# Patient Record
Sex: Female | Born: 1935 | Race: White | Hispanic: No | Smoking: Never smoker
Health system: Southern US, Community
[De-identification: ages and names within clinical notes are randomized; demographics above are authoritative.]

## PROBLEM LIST (undated history)

## (undated) DIAGNOSIS — G309 Alzheimer's disease, unspecified: Secondary | ICD-10-CM

## (undated) DIAGNOSIS — F22 Delusional disorders: Secondary | ICD-10-CM

## (undated) DIAGNOSIS — F0280 Dementia in other diseases classified elsewhere without behavioral disturbance: Secondary | ICD-10-CM

## (undated) HISTORY — PX: FOOT SURGERY: SHX648

---

## 2004-08-16 ENCOUNTER — Ambulatory Visit: Payer: Self-pay | Admitting: Internal Medicine

## 2004-08-28 ENCOUNTER — Ambulatory Visit: Payer: Self-pay | Admitting: Internal Medicine

## 2004-09-04 ENCOUNTER — Ambulatory Visit (HOSPITAL_COMMUNITY): Admission: RE | Admit: 2004-09-04 | Discharge: 2004-09-04 | Payer: Self-pay | Admitting: Internal Medicine

## 2004-09-04 ENCOUNTER — Encounter (INDEPENDENT_AMBULATORY_CARE_PROVIDER_SITE_OTHER): Payer: Self-pay | Admitting: *Deleted

## 2004-09-04 ENCOUNTER — Encounter (INDEPENDENT_AMBULATORY_CARE_PROVIDER_SITE_OTHER): Payer: Self-pay | Admitting: Specialist

## 2004-09-04 ENCOUNTER — Ambulatory Visit: Payer: Self-pay | Admitting: Internal Medicine

## 2009-01-20 DIAGNOSIS — M1990 Unspecified osteoarthritis, unspecified site: Secondary | ICD-10-CM | POA: Insufficient documentation

## 2009-01-20 DIAGNOSIS — F411 Generalized anxiety disorder: Secondary | ICD-10-CM | POA: Insufficient documentation

## 2009-01-20 DIAGNOSIS — M810 Age-related osteoporosis without current pathological fracture: Secondary | ICD-10-CM | POA: Insufficient documentation

## 2009-01-20 DIAGNOSIS — K921 Melena: Secondary | ICD-10-CM

## 2009-01-20 DIAGNOSIS — D126 Benign neoplasm of colon, unspecified: Secondary | ICD-10-CM

## 2009-01-24 ENCOUNTER — Ambulatory Visit: Payer: Self-pay | Admitting: Internal Medicine

## 2009-01-24 DIAGNOSIS — R195 Other fecal abnormalities: Secondary | ICD-10-CM

## 2009-01-24 DIAGNOSIS — Z860100 Personal history of colon polyps, unspecified: Secondary | ICD-10-CM | POA: Insufficient documentation

## 2009-02-01 ENCOUNTER — Ambulatory Visit: Payer: Self-pay | Admitting: Internal Medicine

## 2009-11-07 ENCOUNTER — Emergency Department (HOSPITAL_BASED_OUTPATIENT_CLINIC_OR_DEPARTMENT_OTHER): Admission: EM | Admit: 2009-11-07 | Discharge: 2009-11-07 | Payer: Self-pay | Admitting: Emergency Medicine

## 2010-12-21 LAB — HEPATIC FUNCTION PANEL
ALT: 18 U/L (ref 0–35)
AST: 32 U/L (ref 0–37)
Alkaline Phosphatase: 86 U/L (ref 39–117)
Bilirubin, Direct: 0 mg/dL (ref 0.0–0.3)
Indirect Bilirubin: 0.3 mg/dL (ref 0.3–0.9)

## 2010-12-21 LAB — CBC
MCHC: 33.7 g/dL (ref 30.0–36.0)
Platelets: 215 10*3/uL (ref 150–400)
RDW: 12.6 % (ref 11.5–15.5)

## 2010-12-21 LAB — URINALYSIS, ROUTINE W REFLEX MICROSCOPIC
Bilirubin Urine: NEGATIVE
Glucose, UA: NEGATIVE mg/dL
Hgb urine dipstick: NEGATIVE
Ketones, ur: NEGATIVE mg/dL
Protein, ur: NEGATIVE mg/dL
pH: 6.5 (ref 5.0–8.0)

## 2010-12-21 LAB — BASIC METABOLIC PANEL
BUN: 16 mg/dL (ref 6–23)
Calcium: 9 mg/dL (ref 8.4–10.5)
Creatinine, Ser: 0.7 mg/dL (ref 0.4–1.2)
GFR calc non Af Amer: >60 mL/min (ref >60)
Glucose, Bld: 96 mg/dL (ref 70–99)

## 2010-12-21 LAB — DIFFERENTIAL
Basophils Absolute: 0 10*3/uL (ref 0.0–0.1)
Basophils Relative: 1 % (ref 0–1)
Lymphocytes Relative: 44 % (ref 12–46)
Neutro Abs: 1.7 10*3/uL (ref 1.7–7.7)

## 2013-02-18 ENCOUNTER — Encounter (HOSPITAL_COMMUNITY): Payer: Self-pay | Admitting: *Deleted

## 2013-02-18 ENCOUNTER — Emergency Department (HOSPITAL_COMMUNITY): Payer: Medicare Other

## 2013-02-18 ENCOUNTER — Emergency Department (HOSPITAL_COMMUNITY)
Admission: EM | Admit: 2013-02-18 | Discharge: 2013-02-18 | Disposition: A | Discharge status: Home / Self Care | Payer: Medicare Other | Attending: Emergency Medicine | Admitting: Emergency Medicine

## 2013-02-18 DIAGNOSIS — F911 Conduct disorder, childhood-onset type: Secondary | ICD-10-CM | POA: Insufficient documentation

## 2013-02-18 DIAGNOSIS — F0390 Unspecified dementia without behavioral disturbance: Secondary | ICD-10-CM

## 2013-02-18 DIAGNOSIS — Z87828 Personal history of other (healed) physical injury and trauma: Secondary | ICD-10-CM | POA: Insufficient documentation

## 2013-02-18 DIAGNOSIS — Z79899 Other long term (current) drug therapy: Secondary | ICD-10-CM | POA: Insufficient documentation

## 2013-02-18 DIAGNOSIS — S0003XA Contusion of scalp, initial encounter: Secondary | ICD-10-CM | POA: Insufficient documentation

## 2013-02-18 DIAGNOSIS — Y939 Activity, unspecified: Secondary | ICD-10-CM | POA: Insufficient documentation

## 2013-02-18 DIAGNOSIS — IMO0002 Reserved for concepts with insufficient information to code with codable children: Secondary | ICD-10-CM | POA: Insufficient documentation

## 2013-02-18 DIAGNOSIS — Z880 Allergy status to penicillin: Secondary | ICD-10-CM | POA: Insufficient documentation

## 2013-02-18 DIAGNOSIS — S1093XA Contusion of unspecified part of neck, initial encounter: Secondary | ICD-10-CM | POA: Insufficient documentation

## 2013-02-18 DIAGNOSIS — Y929 Unspecified place or not applicable: Secondary | ICD-10-CM | POA: Insufficient documentation

## 2013-02-18 LAB — URINALYSIS, ROUTINE W REFLEX MICROSCOPIC
Bilirubin Urine: NEGATIVE
Glucose, UA: NEGATIVE mg/dL
Hgb urine dipstick: NEGATIVE
Ketones, ur: 40 mg/dL — AB
Protein, ur: NEGATIVE mg/dL

## 2013-02-18 LAB — COMPREHENSIVE METABOLIC PANEL
Albumin: 3.6 g/dL (ref 3.5–5.2)
Alkaline Phosphatase: 75 U/L (ref 39–117)
BUN: 18 mg/dL (ref 6–23)
Chloride: 103 mEq/L (ref 96–112)
Potassium: 3.8 mEq/L (ref 3.5–5.1)
Sodium: 139 mEq/L (ref 135–145)
Total Bilirubin: 0.4 mg/dL (ref 0.3–1.2)

## 2013-02-18 LAB — CBC
MCH: 28.7 pg (ref 26.0–34.0)
MCHC: 32.8 g/dL (ref 30.0–36.0)
MCV: 87.5 fL (ref 78.0–100.0)
Platelets: 258 10*3/uL (ref 150–400)
RBC: 3.59 MIL/uL — ABNORMAL LOW (ref 3.87–5.11)

## 2013-02-18 MED ORDER — SODIUM CHLORIDE 0.9 % IV BOLUS (SEPSIS)
1000.0000 mL | Freq: Once | INTRAVENOUS | Status: AC
  Administered 2013-02-18: 1000 mL via INTRAVENOUS

## 2013-02-18 NOTE — ED Notes (Signed)
Per [facility] sent pt. Reports pt refusing medication for several days. Displaying aggressive behavior X 1 day. Pt oriented to self. Unable to name date/time/location. Hx of dementia.

## 2013-02-18 NOTE — ED Notes (Signed)
[id number]<HQ> Expected date:<BR> Expected time:<BR> Means of arrival:<BR> Comments:<BR> Dementia/aggressive pt

## 2013-02-18 NOTE — ED Provider Notes (Signed)
History     CSN: [id number]  Arrival date & time 02/18/13  1734   First MD Initiated Contact with Patient 02/18/13 1823      Chief Complaint  Patient presents with  . Altered Mental Status  . Aggressive Behavior     The history is provided by the patient, medical records, the nursing home, a relative and a caregiver. History limited by: Level V caveat: Dementia.   the patient was brought to the emergency department from memory care unit at the nursing facility for decreased oral intake over the past several days and increasing aggressive behavior.  As reported that she normally has a good appetite no the past several days she has not eaten or drank in as much as she usually does and she was sent to the emergency department.  The patient is on Xanax, Haldol, cervical, Depakote. The majority history was obtained from the patient's son, the patient's friend.  No report of recent illness or fever.  She did have trauma to her head approximately one week ago when she struck left side of her head and a small amount of bruising around her left paravertebral region.  She did not seek medical care at that time.  No reported vomiting or diarrhea.  No reported rash.  Family reports patient has been calm and cooperative in the emergency room  History reviewed. No pertinent past medical history.  Past Surgical History  Procedure Laterality Date  . Foot surgery      No family history on file.  History  Substance Use Topics  . Smoking status: Never Smoker   . Smokeless tobacco: Not on file  . Alcohol Use: No    OB History   Grav Para Term Preterm Abortions TAB SAB Ect Mult Living                  Review of Systems  Unable to perform ROS: Dementia    Allergies  Penicillins and Sulfa antibiotics  Home Medications   Current Outpatient Rx  Name  Route  Sig  Dispense  Refill  . ALPRAZolam (XANAX) 1 MG tablet   Oral   Take 0.5-1 mg by mouth 3 (three) times daily. 1 tab in am, 0.5 tab  midday and night         . ALPRAZolam (XANAX) 1 MG tablet   Oral   Take 1 mg by mouth every 4 (four) hours as needed (for agitation).         Marland Kitchen escitalopram (LEXAPRO) 20 MG tablet   Oral   Take 20 mg by mouth daily.         . haloperidol (HALDOL) 1 MG tablet   Oral   Take 1 mg by mouth 4 (four) times daily.         . haloperidol lactate (HALDOL) 5 MG/ML injection   Intramuscular   Inject 10 mg into the muscle every 4 (four) hours as needed (for agitation).         . QUEtiapine (SEROQUEL) 100 MG tablet   Oral   Take 100 mg by mouth 2 (two) times daily.         . divalproex (DEPAKOTE SPRINKLE) 125 MG capsule   Oral   Take 125 mg by mouth 2 (two) times daily.           BP 159/121  Pulse 74  Temp(Src) 98.4 F (36.9 C) (Oral)  Resp 17  SpO2 96%  Physical Exam  Nursing note and  vitals reviewed. Constitutional: She appears well-developed and well-nourished. No distress.  HENT:  Head: Normocephalic and atraumatic.  Small area of ecchymosis to her left paravertebral region laterally.  Extraocular movements are intact.  Eyes: EOM are normal.  Neck: Normal range of motion. Neck supple.  No cervical spine tenderness  Cardiovascular: Normal rate, regular rhythm and normal heart sounds.   Pulmonary/Chest: Effort normal and breath sounds normal.  Abdominal: Soft. She exhibits no distension. There is no tenderness.  Musculoskeletal: Normal range of motion.  No thoracic or lumbar tenderness.  Back was examined without evidence of rash.  Neurological: She is alert.  Oriented to self.  Believes the patient's son is her husband.  Is unsure who the caretaker is in the room.  Otherwise follows commands.  5 out of 5 strength in bilateral upper lower extremity major muscle groups.  Skin: Skin is warm and dry. No rash noted.  Psychiatric: She has a normal mood and affect. Judgment normal.    ED Course  Procedures (including critical care time)  Labs Reviewed  VALPROIC  ACID LEVEL - Abnormal; Notable for the following:    Valproic Acid Lvl <10.0 (*)    All other components within normal limits  CBC - Abnormal; Notable for the following:    RBC 3.59 (*)    Hemoglobin 10.3 (*)    HCT 31.4 (*)    All other components within normal limits  COMPREHENSIVE METABOLIC PANEL - Abnormal; Notable for the following:    Glucose, Bld 103 (*)    GFR calc non Af Amer 82 (*)    All other components within normal limits  URINALYSIS, ROUTINE W REFLEX MICROSCOPIC - Abnormal; Notable for the following:    Ketones, ur 40 (*)    All other components within normal limits   Dg Chest 2 View  02/18/2013   *RADIOLOGY REPORT*  Clinical Data: Altered mental status aggressive behavior  CHEST - 2 VIEW  Comparison: None  Findings: Mild cardiac enlargement without heart failure.  Negative for pneumonia or effusion.  Negative for mass lesion.  IMPRESSION: No acute abnormality.   Original Report Authenticated By: Janeece Riggers, M.D.   Ct Head Wo Contrast  02/18/2013   *RADIOLOGY REPORT*  Clinical Data: Altered mental status  CT HEAD WITHOUT CONTRAST  Technique:  Contiguous axial images were obtained from the base of the skull through the vertex without contrast.  Comparison: None.  Findings: Moderate to advanced atrophy.  Mild chronic microvascular ischemic change in the white matter.  No acute infarct.  No hemorrhage or mass lesion.  The calvarium is intact.  IMPRESSION: Atrophy and chronic microvascular ischemia.  No acute abnormality.   Original Report Authenticated By: Janeece Riggers, M.D.    I personally reviewed the imaging tests through PACS system I reviewed available ER/hospitalization records through the EMR    1. Dementia       MDM  The patient is been calm and cooperative and interactive in the emergency department.  She's tolerating oral fluids.  Her abdominal exam is benign.  Labs, chest x-ray, urine, CT scan of her Adderall within normal limits.  The patient may need some  medication adjustments back at the facility.  I do not believe the patient needs to be admitted to the hospital medically.  I do not believe the patient is be admitted to a psychiatric hospital for geriatric psychiatry consultation.  Probably this can be managed at her facility by her physicians.  I spoke with the patient's son  and friend at length and they are agreeable to outpatient plan.  They feel like the patient has been calm and cooperative in the emergency department as well.return the emergency department for new or worsening symptoms.         Lyanne Co, MD 02/18/13 [phone]

## 2013-03-03 ENCOUNTER — Emergency Department (HOSPITAL_COMMUNITY): Payer: Medicare Other

## 2013-03-03 ENCOUNTER — Emergency Department (HOSPITAL_COMMUNITY)
Admission: EM | Admit: 2013-03-03 | Discharge: 2013-03-03 | Disposition: A | Discharge status: Skilled Nursing Facility | Payer: Medicare Other | Attending: Emergency Medicine | Admitting: Emergency Medicine

## 2013-03-03 ENCOUNTER — Emergency Department (HOSPITAL_COMMUNITY)
Admission: EM | Admit: 2013-03-03 | Discharge: 2013-03-04 | Disposition: A | Discharge status: Skilled Nursing Facility | Payer: PRIVATE HEALTH INSURANCE | Attending: Emergency Medicine | Admitting: Emergency Medicine

## 2013-03-03 DIAGNOSIS — Z880 Allergy status to penicillin: Secondary | ICD-10-CM | POA: Insufficient documentation

## 2013-03-03 DIAGNOSIS — Y9301 Activity, walking, marching and hiking: Secondary | ICD-10-CM | POA: Insufficient documentation

## 2013-03-03 DIAGNOSIS — W19XXXA Unspecified fall, initial encounter: Secondary | ICD-10-CM

## 2013-03-03 DIAGNOSIS — M542 Cervicalgia: Secondary | ICD-10-CM | POA: Insufficient documentation

## 2013-03-03 DIAGNOSIS — Z79899 Other long term (current) drug therapy: Secondary | ICD-10-CM | POA: Insufficient documentation

## 2013-03-03 DIAGNOSIS — IMO0002 Reserved for concepts with insufficient information to code with codable children: Secondary | ICD-10-CM | POA: Insufficient documentation

## 2013-03-03 DIAGNOSIS — F0390 Unspecified dementia without behavioral disturbance: Secondary | ICD-10-CM | POA: Insufficient documentation

## 2013-03-03 DIAGNOSIS — R296 Repeated falls: Secondary | ICD-10-CM | POA: Insufficient documentation

## 2013-03-03 DIAGNOSIS — Y9210 Unspecified residential institution as the place of occurrence of the external cause: Secondary | ICD-10-CM | POA: Insufficient documentation

## 2013-03-03 DIAGNOSIS — Y9289 Other specified places as the place of occurrence of the external cause: Secondary | ICD-10-CM | POA: Insufficient documentation

## 2013-03-03 DIAGNOSIS — Y939 Activity, unspecified: Secondary | ICD-10-CM | POA: Insufficient documentation

## 2013-03-03 DIAGNOSIS — Z9889 Other specified postprocedural states: Secondary | ICD-10-CM | POA: Insufficient documentation

## 2013-03-03 HISTORY — DX: Dementia in other diseases classified elsewhere, unspecified severity, without behavioral disturbance, psychotic disturbance, mood disturbance, and anxiety: F02.80

## 2013-03-03 HISTORY — DX: Alzheimer's disease, unspecified: G30.9

## 2013-03-03 NOTE — ED Notes (Signed)
Pt cleared from KED by Dr. Oletta Cohn.

## 2013-03-03 NOTE — ED Notes (Signed)
[id number]<AV> Expected date:<BR> Expected time:<BR> Means of arrival:<BR> Comments:<BR> EMS/77 yo female from SNF with fall

## 2013-03-03 NOTE — ED Notes (Signed)
Per EMS, pt from [facility], pt had unwitnessed fall while walking to the BR without her wheelchair.  Pt hx of Alzheimer's, disoriented, pt here yesterday for same complaint.  Pt denies pain to back and neck upon palpation, able to move freely. Pt not on any blood thinners, sent her from facility per protocol.

## 2013-03-03 NOTE — ED Notes (Signed)
Pt from Nursing home, [facility] ad had an un witnessed fall per EMS. Pt was found lying on right shoulder. No bleeding. No head injuries. Pt c/o of neck pain. Pt placed on KED and C-Collar. Pt is not on blood thinners.

## 2013-03-03 NOTE — ED Notes (Signed)
Bed:WA06<BR> Expected date:<BR> Expected time:<BR> Means of arrival:<BR> Comments:<BR>

## 2013-03-03 NOTE — ED Provider Notes (Signed)
History     CSN: [id number]  Arrival date & time 03/03/13  0945   First MD Initiated Contact with Patient 03/03/13 [phone]      Chief Complaint  Patient presents with  . Fall    (Consider location/radiation/quality/duration/timing/severity/associated sxs/prior treatment) HPI Comments: Patient brought to the ER by ambulance after an unwitnessed fall. Patient reportedly was found lying on her right side on the ground at the nursing home. She has a baseline mentioned, cannot add any additional history. Level V Caveat applies due to dementia.  Patient is a 78 y.o. female presenting with fall.  Fall    No past medical history on file.  Past Surgical History  Procedure Laterality Date  . Foot surgery      No family history on file.  History  Substance Use Topics  . Smoking status: Never Smoker   . Smokeless tobacco: Not on file  . Alcohol Use: No    OB History   Grav Para Term Preterm Abortions TAB SAB Ect Mult Living                  Review of Systems  Unable to perform ROS: Dementia    Allergies  Penicillins and Sulfa antibiotics  Home Medications   Current Outpatient Rx  Name  Route  Sig  Dispense  Refill  . ALPRAZolam (XANAX) 1 MG tablet   Oral   Take 0.5-1 mg by mouth 3 (three) times daily. 1 tab in am, 0.5 tab midday and night         . ALPRAZolam (XANAX) 1 MG tablet   Oral   Take 1 mg by mouth every 4 (four) hours as needed (for agitation).         Marland Kitchen divalproex (DEPAKOTE SPRINKLE) 125 MG capsule   Oral   Take 125 mg by mouth 2 (two) times daily.         Marland Kitchen escitalopram (LEXAPRO) 20 MG tablet   Oral   Take 20 mg by mouth daily.         . haloperidol (HALDOL) 1 MG tablet   Oral   Take 1 mg by mouth 4 (four) times daily.         . haloperidol lactate (HALDOL) 5 MG/ML injection   Intramuscular   Inject 10 mg into the muscle every 4 (four) hours as needed (for agitation).         . QUEtiapine (SEROQUEL) 100 MG tablet   Oral   Take  100 mg by mouth 2 (two) times daily.           BP 148/79  Pulse 73  Temp(Src) 97.9 F (36.6 C) (Oral)  Resp 20  SpO2 96%  Physical Exam  Constitutional: She appears well-developed and well-nourished. No distress.  HENT:  Head: Normocephalic and atraumatic.  Right Ear: Hearing normal.  Left Ear: Hearing normal.  Nose: Nose normal.  Mouth/Throat: Oropharynx is clear and moist and mucous membranes are normal.  Eyes: Conjunctivae and EOM are normal. Pupils are equal, round, and reactive to light.  Neck: Normal range of motion. Neck supple.  Cardiovascular: Regular rhythm, S1 normal and S2 normal.  Exam reveals no gallop and no friction rub.   No murmur heard. Pulmonary/Chest: Effort normal and breath sounds normal. No respiratory distress. She exhibits no tenderness.  Abdominal: Soft. Normal appearance and bowel sounds are normal. There is no hepatosplenomegaly. There is no tenderness. There is no rebound, no guarding, no tenderness at McBurney's point and  negative Murphy's sign. No hernia.  Musculoskeletal: Normal range of motion.  Neurological: She is alert. She has normal strength. She is disoriented. No cranial nerve deficit or sensory deficit. Coordination normal. GCS eye subscore is 4. GCS verbal subscore is 5. GCS motor subscore is 6.  Skin: Skin is warm, dry and intact. No rash noted. No cyanosis.  Psychiatric: She has a normal mood and affect. Her speech is normal and behavior is normal. Thought content normal.    ED Course  Procedures (including critical care time)  Labs Reviewed - No data to display Dg Chest 1 View  03/03/2013   *RADIOLOGY REPORT*  Clinical Data: Confusion.  Fall.  CHEST - 1 VIEW  Comparison: 02/18/2013  Findings: Heart is enlarged.  Aorta is tortuous.  There are no focal consolidations or pleural effusions.  No acute, displaced fracture.  No pneumothorax.  There are perihilar bronchitic changes present.  Degenerative changes are seen in the spine.   IMPRESSION:  1.  Cardiomegaly.  No edema. 2.  Bronchitic changes. 3. No evidence for acute  abnormality.   Original Report Authenticated By: Norva Pavlov, M.D.   Dg Thoracic Spine 2 View  03/03/2013   *RADIOLOGY REPORT*  Clinical Data: The patient is confused.  Fall.  THORACIC SPINE - 2 VIEW  Comparison: 02/18/2013  Findings: There are degenerative changes in the mid thoracic spine. Alignment is normal.  No evidence for acute fracture or subluxation.  No evidence for posterior, acute displaced rib fractures.  IMPRESSION:  1.  Degenerative changes. 2. No evidence for acute  abnormality.   Original Report Authenticated By: Norva Pavlov, M.D.   Dg Lumbar Spine Complete  03/03/2013   *RADIOLOGY REPORT*  Clinical Data: Fall  LUMBAR SPINE - COMPLETE 4+ VIEW  Comparison: None.  Findings: Five views of the lumbar spine submitted.  No acute fracture or subluxation.  Diffuse osteopenia.  There is about  3 mm anterolisthesis L4 on  L5 vertebral body probable chronic in nature.  Moderate disc space flattening noted at L5 S1 level.  Facet degenerative changes noted at L4 and L5 level.  The alignment and vertebral height are preserved.  IMPRESSION: No acute fracture or subluxation there is about to 3 mm anterolisthesis L4 on L5 vertebral body.  Atherosclerotic calcifications of the abdominal aorta.   Original Report Authenticated By: Natasha Mead, M.D.   Dg Pelvis 1-2 Views  03/03/2013   *RADIOLOGY REPORT*  Clinical Data: Fall  PELVIS - 1-2 VIEW  Comparison: None.  Findings: Single frontal view of the pelvis submitted.  Study is markedly limited by diffuse osteopenia.  No definite acute fracture or subluxation.  Pelvic phleboliths are noted.  Degenerative changes are noted pubic symphysis.  Degenerative changes are noted bilateral SI joints.  IMPRESSION: No definite fracture or subluxation.  Limited study by diffuse osteopenia.  Degenerative changes as described above.   Original Report Authenticated By: Natasha Mead, M.D.    Ct Head Wo Contrast  03/03/2013   *RADIOLOGY REPORT*  Clinical Data: Headache, neck pain, and confusion after a fall.  CT HEAD WITHOUT CONTRAST  Technique:  Contiguous axial images were obtained from the base of the skull through the vertex without contrast.  Comparison: 02/18/2013  Findings: There is no acute intracranial hemorrhage, infarction, or mass lesion.  There is diffuse cerebral cortical and cerebellar atrophy, most prominent in the temporal lobes with secondary ventricular dilatation.  There is slight lucency in the left side of the pons which is unchanged as well as  scattered areas of periventricular white matter lucency consistent with chronic small vessel ischemic disease.  No osseous abnormality.  IMPRESSION: No acute intracranial abnormality.  Atrophy and chronic small vessel ischemic changes, stable.   Original Report Authenticated By: Francene Boyers, M.D.   Ct Cervical Spine Wo Contrast  03/03/2013   *RADIOLOGY REPORT*  Clinical Data: Headache, neck pain, and confusion after a fall.  CT CERVICAL SPINE WITHOUT CONTRAST  Technique:  Multidetector CT imaging of the cervical spine was performed. Multiplanar CT image reconstructions were also generated.  Comparison: None.  Findings: There is no fracture or prevertebral soft tissue swelling.  There is a 3 mm subluxation of C4 on C5 which is felt to be due to severe left facet arthritis.  I do not think this is acute.  The patient has fairly severe degenerative disc disease of the C5-6 and C6-7.  IMPRESSION: No acute abnormality of the cervical spine.   Original Report Authenticated By: Francene Boyers, M.D.     Diagnosis: Fall without injury    MDM  Patient presents to the ER from the nursing home after a fall. Was unwitnessed, patient was found floor. At arrival she denies any pain, but is very demented, can provide little information. Workup was therefore performed including CT scan of head, cervical spine, x-rays of chest, thoracic spine,  lumbosacral spine and pelvis. All imaging was without any evidence of acute injury. Patient will be returned to the nursing home.        Gilda Crease, MD 03/03/13 1154

## 2013-03-04 ENCOUNTER — Encounter (HOSPITAL_COMMUNITY): Payer: Self-pay | Admitting: Emergency Medicine

## 2013-03-04 NOTE — ED Provider Notes (Signed)
History     CSN: [id number]  Arrival date & time 03/03/13  2328   First MD Initiated Contact with Patient 03/03/13 2355      Chief Complaint  Patient presents with  . Fall    (Consider location/radiation/quality/duration/timing/severity/associated sxs/prior treatment) HPI Comments: Patient sent from [facility] after an unwitnessed fall.  Reports, she was walking to the bathroom without her wheelchair.  She was seen her yesterday after a similar episode at that time.  She was x-rayed, including head, neck, thoracic, lumbar, and pelvis, all within normal parameters.  Today.  She is noted to have an abrasion on her right knee.  Per report the patient is at her baseline mental status  Patient is a 78 y.o. female presenting with fall. The history is provided by the patient.  Fall This is a new problem. The current episode started today. The problem has been unchanged. Pertinent negatives include no chills or fever.    No past medical history on file.  Past Surgical History  Procedure Laterality Date  . Foot surgery      No family history on file.  History  Substance Use Topics  . Smoking status: Never Smoker   . Smokeless tobacco: Not on file  . Alcohol Use: No    OB History   Grav Para Term Preterm Abortions TAB SAB Ect Mult Living                  Review of Systems  Unable to perform ROS: Dementia  Constitutional: Negative for fever and chills.  HENT: Negative for facial swelling.   Respiratory: Negative for shortness of breath.   Cardiovascular: Negative for leg swelling.  Skin: Positive for wound.  All other systems reviewed and are negative.    Allergies  Penicillins and Sulfa antibiotics  Home Medications   Current Outpatient Rx  Name  Route  Sig  Dispense  Refill  . ALPRAZolam (XANAX) 1 MG tablet   Oral   Take 0.5-1 mg by mouth 3 (three) times daily. Take 1 mg every morning, 1/2 tab at midday, and 1/2 tab at bedtime         . ALPRAZolam (XANAX) 1  MG tablet   Oral   Take 1 mg by mouth every 4 (four) hours as needed for anxiety.          . divalproex (DEPAKOTE SPRINKLE) 125 MG capsule   Oral   Take 125 mg by mouth 2 (two) times daily.         Marland Kitchen escitalopram (LEXAPRO) 20 MG tablet   Oral   Take 20 mg by mouth every morning.          . haloperidol (HALDOL) 1 MG tablet   Oral   Take 1 mg by mouth every 6 (six) hours as needed (agitation).          . haloperidol lactate (HALDOL) 5 MG/ML injection   Intramuscular   Inject 10 mg into the muscle every 4 (four) hours as needed (severe agitation).          . QUEtiapine (SEROQUEL) 100 MG tablet   Oral   Take 100 mg by mouth 2 (two) times daily.           BP 121/61  Pulse 78  Temp(Src) 97.5 F (36.4 C) (Oral)  Resp 12  SpO2 100%  Physical Exam  Nursing note and vitals reviewed. Constitutional: She appears well-developed and well-nourished.  HENT:  Head: Normocephalic.  Eyes: Pupils  are equal, round, and reactive to light.  Neck: Normal range of motion.  Cardiovascular: Normal rate and regular rhythm.   Pulmonary/Chest: Effort normal and breath sounds normal.  Abdominal: Soft. She exhibits no distension.  Musculoskeletal: Normal range of motion. She exhibits no edema and no tenderness.  Abrasion to right knee  Neurological: She is alert.  Patient is awake, alert, but close as to her surroundings  Skin: Skin is warm and dry.  Superficial abrasion to right knee    ED Course  Procedures (including critical care time)  Labs Reviewed - No data to display Dg Chest 1 View  03/03/2013   *RADIOLOGY REPORT*  Clinical Data: Confusion.  Fall.  CHEST - 1 VIEW  Comparison: 02/18/2013  Findings: Heart is enlarged.  Aorta is tortuous.  There are no focal consolidations or pleural effusions.  No acute, displaced fracture.  No pneumothorax.  There are perihilar bronchitic changes present.  Degenerative changes are seen in the spine.  IMPRESSION:  1.  Cardiomegaly.  No  edema. 2.  Bronchitic changes. 3. No evidence for acute  abnormality.   Original Report Authenticated By: Norva Pavlov, M.D.   Dg Thoracic Spine 2 View  03/03/2013   *RADIOLOGY REPORT*  Clinical Data: The patient is confused.  Fall.  THORACIC SPINE - 2 VIEW  Comparison: 02/18/2013  Findings: There are degenerative changes in the mid thoracic spine. Alignment is normal.  No evidence for acute fracture or subluxation.  No evidence for posterior, acute displaced rib fractures.  IMPRESSION:  1.  Degenerative changes. 2. No evidence for acute  abnormality.   Original Report Authenticated By: Norva Pavlov, M.D.   Dg Lumbar Spine Complete  03/03/2013   *RADIOLOGY REPORT*  Clinical Data: Fall  LUMBAR SPINE - COMPLETE 4+ VIEW  Comparison: None.  Findings: Five views of the lumbar spine submitted.  No acute fracture or subluxation.  Diffuse osteopenia.  There is about  3 mm anterolisthesis L4 on  L5 vertebral body probable chronic in nature.  Moderate disc space flattening noted at L5 S1 level.  Facet degenerative changes noted at L4 and L5 level.  The alignment and vertebral height are preserved.  IMPRESSION: No acute fracture or subluxation there is about to 3 mm anterolisthesis L4 on L5 vertebral body.  Atherosclerotic calcifications of the abdominal aorta.   Original Report Authenticated By: Natasha Mead, M.D.   Dg Pelvis 1-2 Views  03/03/2013   *RADIOLOGY REPORT*  Clinical Data: Fall  PELVIS - 1-2 VIEW  Comparison: None.  Findings: Single frontal view of the pelvis submitted.  Study is markedly limited by diffuse osteopenia.  No definite acute fracture or subluxation.  Pelvic phleboliths are noted.  Degenerative changes are noted pubic symphysis.  Degenerative changes are noted bilateral SI joints.  IMPRESSION: No definite fracture or subluxation.  Limited study by diffuse osteopenia.  Degenerative changes as described above.   Original Report Authenticated By: Natasha Mead, M.D.   Ct Head Wo Contrast  03/03/2013    *RADIOLOGY REPORT*  Clinical Data: Headache, neck pain, and confusion after a fall.  CT HEAD WITHOUT CONTRAST  Technique:  Contiguous axial images were obtained from the base of the skull through the vertex without contrast.  Comparison: 02/18/2013  Findings: There is no acute intracranial hemorrhage, infarction, or mass lesion.  There is diffuse cerebral cortical and cerebellar atrophy, most prominent in the temporal lobes with secondary ventricular dilatation.  There is slight lucency in the left side of the pons which is unchanged  as well as scattered areas of periventricular white matter lucency consistent with chronic small vessel ischemic disease.  No osseous abnormality.  IMPRESSION: No acute intracranial abnormality.  Atrophy and chronic small vessel ischemic changes, stable.   Original Report Authenticated By: Francene Boyers, M.D.   Ct Cervical Spine Wo Contrast  03/03/2013   *RADIOLOGY REPORT*  Clinical Data: Headache, neck pain, and confusion after a fall.  CT CERVICAL SPINE WITHOUT CONTRAST  Technique:  Multidetector CT imaging of the cervical spine was performed. Multiplanar CT image reconstructions were also generated.  Comparison: None.  Findings: There is no fracture or prevertebral soft tissue swelling.  There is a 3 mm subluxation of C4 on C5 which is felt to be due to severe left facet arthritis.  I do not think this is acute.  The patient has fairly severe degenerative disc disease of the C5-6 and C6-7.  IMPRESSION: No acute abnormality of the cervical spine.   Original Report Authenticated By: Francene Boyers, M.D.     1. Fall, initial encounter       MDM   Patient ambulated in the hall without difficulty.  At this time.  I do not, think it's necessary to rex-ray.  There is no deformity.  No hematomas, ecchymotic areas or change in baseline mental status        Arman Filter, NP 03/04/13 0153

## 2013-03-05 NOTE — ED Provider Notes (Signed)
Medical screening examination/treatment/procedure(s) were performed by non-physician practitioner and as supervising physician I was immediately available for consultation/collaboration.  Nathania Waldman, MD 03/05/13 0531 

## 2014-01-07 ENCOUNTER — Encounter: Payer: Self-pay | Admitting: Internal Medicine

## 2014-04-21 ENCOUNTER — Emergency Department (HOSPITAL_COMMUNITY)
Admission: EM | Admit: 2014-04-21 | Discharge: 2014-04-21 | Disposition: A | Discharge status: Home / Self Care | Payer: PRIVATE HEALTH INSURANCE | Attending: Emergency Medicine | Admitting: Emergency Medicine

## 2014-04-21 ENCOUNTER — Encounter (HOSPITAL_COMMUNITY): Payer: Self-pay | Admitting: Emergency Medicine

## 2014-04-21 ENCOUNTER — Emergency Department (HOSPITAL_COMMUNITY): Payer: PRIVATE HEALTH INSURANCE

## 2014-04-21 DIAGNOSIS — Z79899 Other long term (current) drug therapy: Secondary | ICD-10-CM | POA: Diagnosis not present

## 2014-04-21 DIAGNOSIS — R296 Repeated falls: Secondary | ICD-10-CM | POA: Diagnosis not present

## 2014-04-21 DIAGNOSIS — G309 Alzheimer's disease, unspecified: Secondary | ICD-10-CM | POA: Diagnosis not present

## 2014-04-21 DIAGNOSIS — F0280 Dementia in other diseases classified elsewhere without behavioral disturbance: Secondary | ICD-10-CM | POA: Insufficient documentation

## 2014-04-21 DIAGNOSIS — Y929 Unspecified place or not applicable: Secondary | ICD-10-CM | POA: Insufficient documentation

## 2014-04-21 DIAGNOSIS — S0180XA Unspecified open wound of other part of head, initial encounter: Secondary | ICD-10-CM | POA: Diagnosis not present

## 2014-04-21 DIAGNOSIS — S1093XA Contusion of unspecified part of neck, initial encounter: Secondary | ICD-10-CM | POA: Diagnosis present

## 2014-04-21 DIAGNOSIS — S0003XA Contusion of scalp, initial encounter: Secondary | ICD-10-CM | POA: Diagnosis present

## 2014-04-21 DIAGNOSIS — S0083XA Contusion of other part of head, initial encounter: Secondary | ICD-10-CM | POA: Insufficient documentation

## 2014-04-21 DIAGNOSIS — F0390 Unspecified dementia without behavioral disturbance: Secondary | ICD-10-CM | POA: Insufficient documentation

## 2014-04-21 DIAGNOSIS — W19XXXA Unspecified fall, initial encounter: Secondary | ICD-10-CM

## 2014-04-21 DIAGNOSIS — Y9389 Activity, other specified: Secondary | ICD-10-CM | POA: Diagnosis not present

## 2014-04-21 HISTORY — DX: Delusional disorders: F22

## 2014-04-21 NOTE — ED Provider Notes (Signed)
LACERATION REPAIR Performed by: Michele Mcalpine Authorized by: Michele Mcalpine Consent: Verbal consent obtained. Risks and benefits: risks, benefits and alternatives were discussed Consent given by: patient Patient identity confirmed: provided demographic data Prepped and Draped in normal sterile fashion Wound explored  Laceration Location: right eyebrow  Laceration Length: 2 cm  No Foreign Bodies seen or palpated  Anesthesia: local infiltration  Local anesthetic: lidocaine 2% without epinephrine  Anesthetic total: 2 ml  Irrigation method: syringe Amount of cleaning: standard  Skin closure: 6-0 prolene  Number of sutures: 4  Technique: simple interrupted  Patient tolerance: Patient tolerated the procedure well with no immediate complications.   Illene Labrador, PA-C 04/21/14 [phone]

## 2014-04-21 NOTE — ED Provider Notes (Signed)
I personally evaluated the patient during the encounter. Please see laceration note      Hoy Morn, MD 04/21/14 [phone]

## 2014-04-21 NOTE — ED Notes (Signed)
Bed: WA09 Expected date: 04/21/14 Expected time: 5:01 AM Means of arrival: Ambulance Comments: Fall, eye laceration

## 2014-04-21 NOTE — ED Notes (Signed)
Per EMS report: pt from [facility]: Pt had an un witnessed fall. Pt hx of dementia but is at her baseline.  Pt doesn't remember the fall. Pt denies any pain. EMS only noted an 1 inch laceration at her right eyebrow. Swelling noted at the site but bleeding controlled.  Pt able to bear weight but is normally not supposed to get up by herself.  Pt does not take blood thinners. Pt hx of dementia, depression.

## 2014-04-21 NOTE — ED Provider Notes (Signed)
CSN: [id number]     Arrival date & time 04/21/14  4098 History   First MD Initiated Contact with Patient 04/21/14 [phone]     Chief Complaint  Patient presents with  . Fall    Level V caveat: Dementia   The history is provided by the EMS personnel and the nursing home. History limited by: Level 5 caveat: Dementia.   patient was found at nursing home with injury over her right eye.  Unwitnessed fall.  As reported that the patient is a baseline.  She does have a history of dementia.  She denies pain.  She is known to have a laceration of her forehead.  Review of the [facility] demonstrates no anticoagulants.    Past Medical History  Diagnosis Date  . Alzheimer disease   . Delusions    Past Surgical History  Procedure Laterality Date  . Foot surgery     No family history on file. History  Substance Use Topics  . Smoking status: Never Smoker   . Smokeless tobacco: Not on file  . Alcohol Use: No   OB History   Grav Para Term Preterm Abortions TAB SAB Ect Mult Living                 Review of Systems  Unable to perform ROS: Dementia      Allergies  Penicillins and Sulfa antibiotics  Home Medications   Prior to Admission medications   Medication Sig Start Date End Date Taking? Authorizing Provider  ALPRAZolam Duanne Moron) 1 MG tablet Take 0.5-1 mg by mouth 3 (three) times daily. Take 1 mg every morning, 1/2 tab at midday, and 1/2 tab at bedtime   Yes Historical Provider, MD  cholecalciferol (VITAMIN D) 1000 UNITS tablet Take 1,000 Units by mouth daily.   Yes Historical Provider, MD  divalproex (DEPAKOTE SPRINKLE) 125 MG capsule Take 125 mg by mouth 2 (two) times daily.   Yes Historical Provider, MD  ferrous sulfate 325 (65 FE) MG tablet Take 325 mg by mouth daily.   Yes Historical Provider, MD  haloperidol (HALDOL) 1 MG tablet Take 1 mg by mouth 3 (three) times daily.    Yes Historical Provider, MD  hydrochlorothiazide (HYDRODIURIL) 12.5 MG tablet Take 12.5 mg by mouth daily.   Yes  Historical Provider, MD  mirtazapine (REMERON) 15 MG tablet Take 7.5 mg by mouth at bedtime.   Yes Historical Provider, MD  PRESCRIPTION MEDICATION Apply 1 mL topically every 8 (eight) hours as needed (agitation).   Yes Historical Provider, MD  QUEtiapine (SEROQUEL) 100 MG tablet Take 50-100 mg by mouth 2 (two) times daily. 1/2 tablet(50mg ) in the morning an 1 tablet (100mg ) at bedtime   Yes Historical Provider, MD  sertraline (ZOLOFT) 50 MG tablet Take 25 mg by mouth daily.   Yes Historical Provider, MD   BP 143/58  Pulse 92  Temp(Src) 97.9 F (36.6 C) (Oral)  Resp 20  SpO2 99% Physical Exam  Nursing note and vitals reviewed. Constitutional: She appears well-developed and well-nourished. No distress.  HENT:  Head: Normocephalic.  Right superolateral rim with small laceration just above her eyebrow.  No active bleeding.  Mild abrasion and swelling with associated ecchymosis of her right paravertebral region.  Extraocular movements are intact  Eyes: EOM are normal.  Neck: Normal range of motion.  No cervical step-offs  Cardiovascular: Normal rate, regular rhythm and normal heart sounds.   Pulmonary/Chest: Effort normal and breath sounds normal.  Abdominal: Soft. She exhibits no distension. There  is no tenderness.  Musculoskeletal: Normal range of motion.  Full range of motion bilateral hips and knees  Neurological: She is alert.  Follow simple commands.  Moves all 4 extremities  Skin: Skin is warm and dry.  Psychiatric: She has a normal mood and affect. Judgment normal.    ED Course  Procedures (including critical care time) Labs Review Labs Reviewed - No data to display  Imaging Review Ct Head Wo Contrast  04/21/2014   CLINICAL DATA:  History of dementia; now with unwitnessed fall with laceration above the right eyebrow  EXAM: CT HEAD WITHOUT CONTRAST  CT CERVICAL SPINE WITHOUT CONTRAST  TECHNIQUE: Multidetector CT imaging of the head and cervical spine was performed following  the standard protocol without intravenous contrast. Multiplanar CT image reconstructions of the cervical spine were also generated.  COMPARISON:  CT scan of the brain and cervical spine dated March 03, 2013  FINDINGS: CT HEAD FINDINGS  There is moderate diffuse cerebral and cerebellar atrophy with compensatory ventriculomegaly. There is no intracranial hemorrhage nor acute ischemic change. The cerebellum and brainstem are unremarkable.  The observed paranasal sinuses and mastoid air cells are clear. There is no skull fracture. There is mild soft tissue swelling above the right orbit with associated laceration.  CT CERVICAL SPINE FINDINGS  The cervical vertebral bodies are preserved in height. There is disc space narrowing at C5-6 and at C6-7. The prevertebral soft tissue spaces are normal. There is facet joint degenerative change at multiple levels with fusion at C4-5 on the left. The odontoid is intact. The pulmonary apices exhibit mild scarring.  IMPRESSION: 1. There is no acute intracranial hemorrhage nor other acute posttraumatic injury of the brain. 2. There are age related atrophic changes which are stable. 3. There is no acute skull fracture. 4. There is degenerative change of the midcervical spine but no acute fracture or dislocation.   Electronically Signed   By: David  [place]   On: 04/21/2014 07:10   Ct Cervical Spine Wo Contrast  04/21/2014   CLINICAL DATA:  History of dementia; now with unwitnessed fall with laceration above the right eyebrow  EXAM: CT HEAD WITHOUT CONTRAST  CT CERVICAL SPINE WITHOUT CONTRAST  TECHNIQUE: Multidetector CT imaging of the head and cervical spine was performed following the standard protocol without intravenous contrast. Multiplanar CT image reconstructions of the cervical spine were also generated.  COMPARISON:  CT scan of the brain and cervical spine dated March 03, 2013  FINDINGS: CT HEAD FINDINGS  There is moderate diffuse cerebral and cerebellar atrophy with compensatory  ventriculomegaly. There is no intracranial hemorrhage nor acute ischemic change. The cerebellum and brainstem are unremarkable.  The observed paranasal sinuses and mastoid air cells are clear. There is no skull fracture. There is mild soft tissue swelling above the right orbit with associated laceration.  CT CERVICAL SPINE FINDINGS  The cervical vertebral bodies are preserved in height. There is disc space narrowing at C5-6 and at C6-7. The prevertebral soft tissue spaces are normal. There is facet joint degenerative change at multiple levels with fusion at C4-5 on the left. The odontoid is intact. The pulmonary apices exhibit mild scarring.  IMPRESSION: 1. There is no acute intracranial hemorrhage nor other acute posttraumatic injury of the brain. 2. There are age related atrophic changes which are stable. 3. There is no acute skull fracture. 4. There is degenerative change of the midcervical spine but no acute fracture or dislocation.   Electronically Signed   By: Shanon Brow  [place]   On: 04/21/2014 07:10  I personally reviewed the imaging tests through PACS system I reviewed available ER/hospitalization records through the EMR    EKG Interpretation None      MDM   Final diagnoses:  Fall, initial encounter  Facial contusion, initial encounter   Images negative. Full ROM of hips. Laceration repaired. Please see midlevel documentation laceration repair note    Hoy Morn, MD 04/21/14 [phone]

## 2014-06-01 DEATH — deceased

## 2014-08-29 IMAGING — CT CT HEAD W/O CM
3 of 4 series · 14 of 30 positions shown, 16 images · non-contrast
Comparison: CT scan of the brain and cervical spine dated March 03, 2013

CLINICAL DATA: History of dementia; now with unwitnessed fall with
laceration above the right eyebrow

EXAM:
CT HEAD WITHOUT CONTRAST
CT CERVICAL SPINE WITHOUT CONTRAST
TECHNIQUE: Multidetector CT imaging of the head and cervical spine was
performed following the standard protocol without intravenous
contrast. Multiplanar CT image reconstructions of the cervical spine
were also generated.

[Series 3: head w/o · axial · non-contrast · 0.43mm/px · z∈[-86,-36]mm · 2 of 30 slices shown]
[im 10/30  brain]
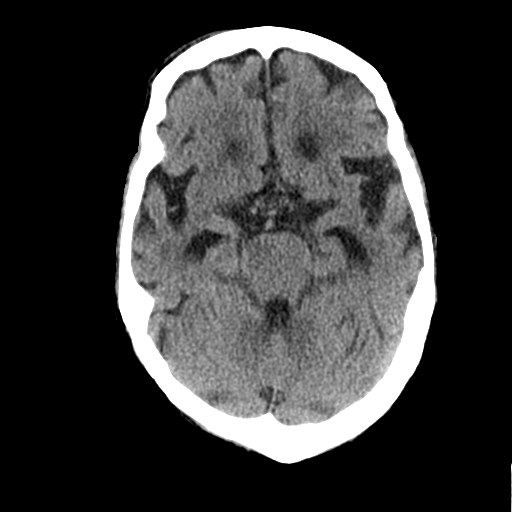
[im 20/30  brain]
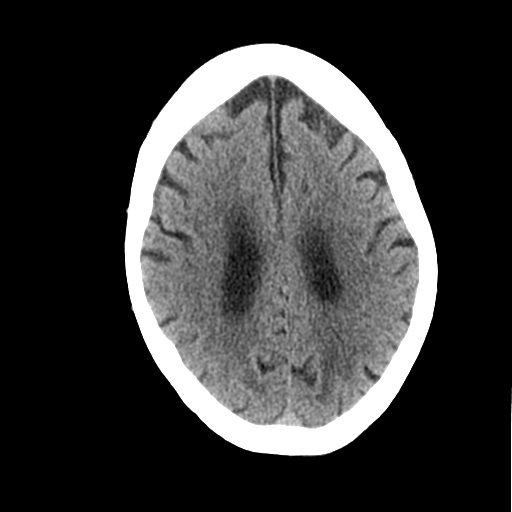

[Series 4: bone windows · axial · 0.43mm/px · z∈[-107,-35]mm · 4 of 50 slices shown]
[im 9/50  bone]
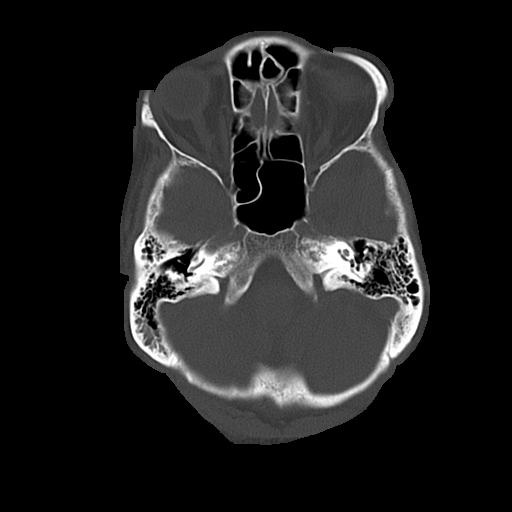
[im 17/50  bone]
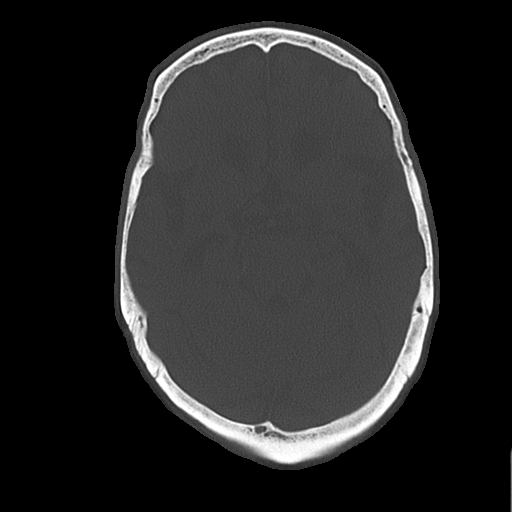
[im 25/50  bone]
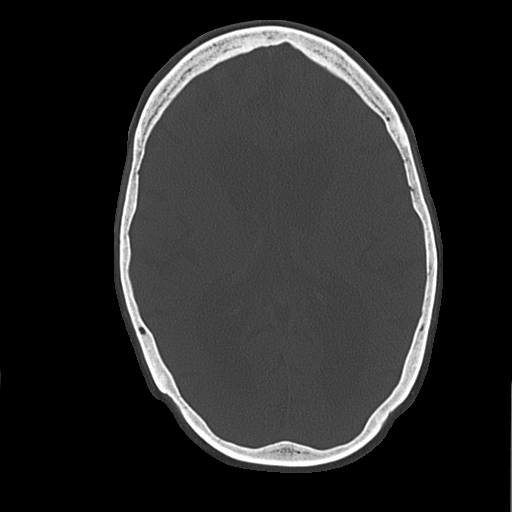
[im 33/50  bone]
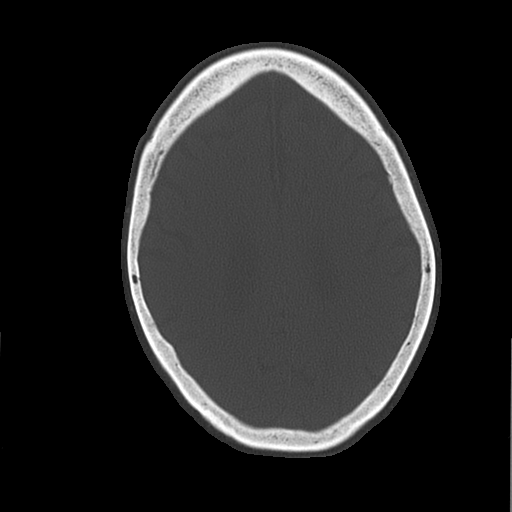

[Series 7: axial recon · axial · 0.18mm/px · z∈[-242,-139]mm · 8 of 77 slices shown, 10 images]
[im 9/77  brain]
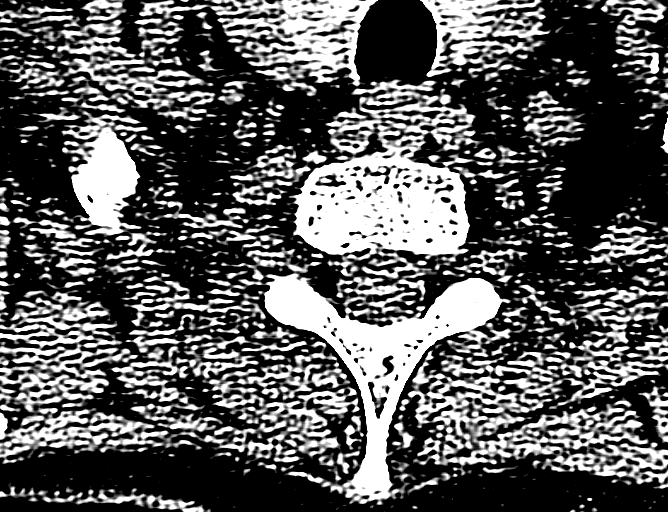
[im 9/77  bone]
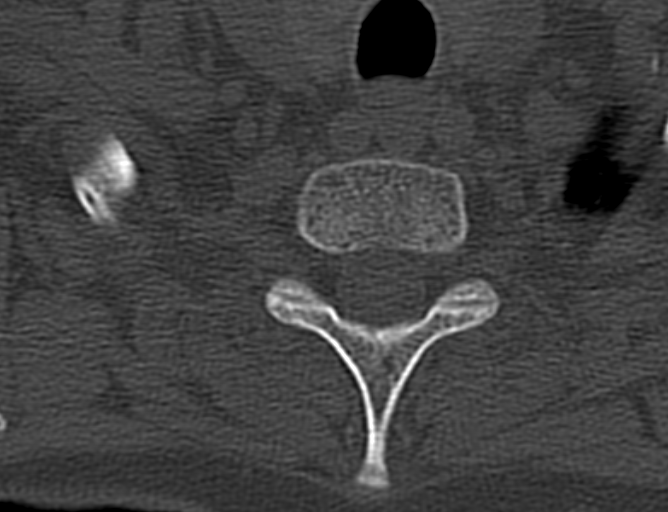
[im 17/77  brain]
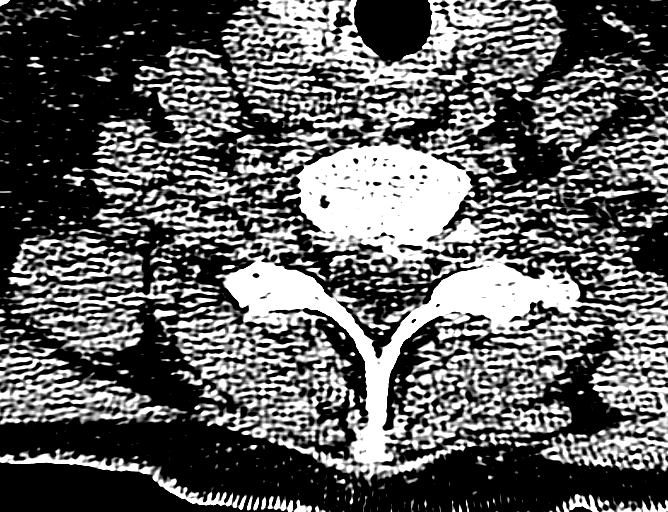
[im 26/77  brain]
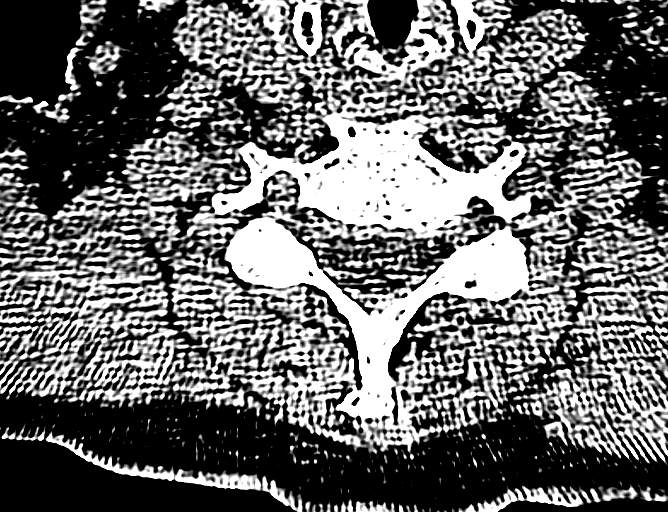
[im 34/77  brain]
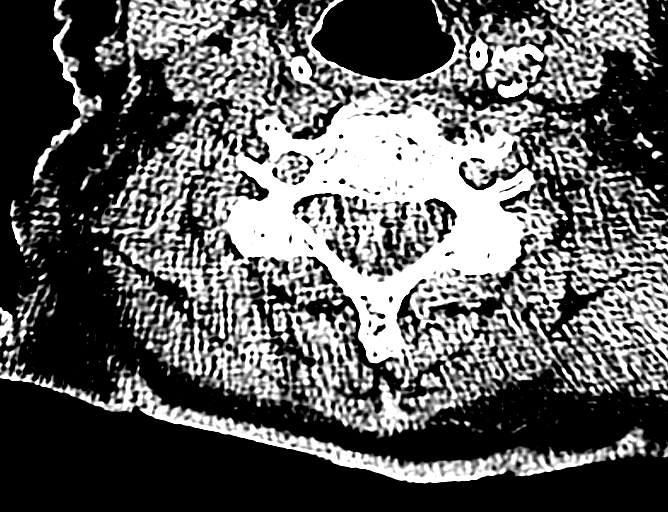
[im 43/77  brain]
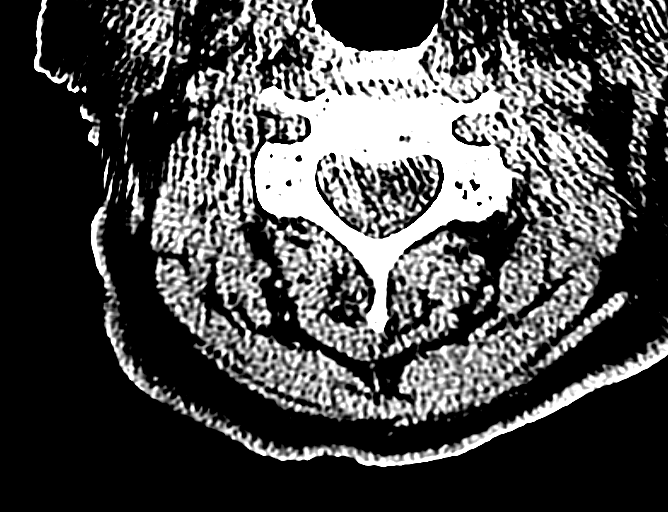
[im 43/77  bone]
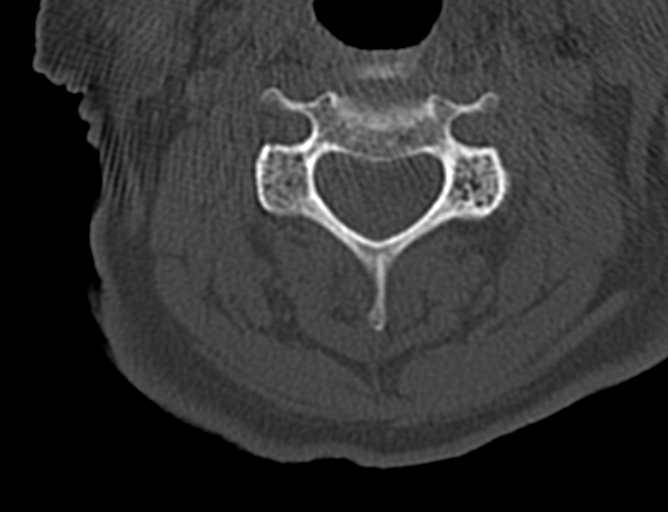
[im 51/77  brain]
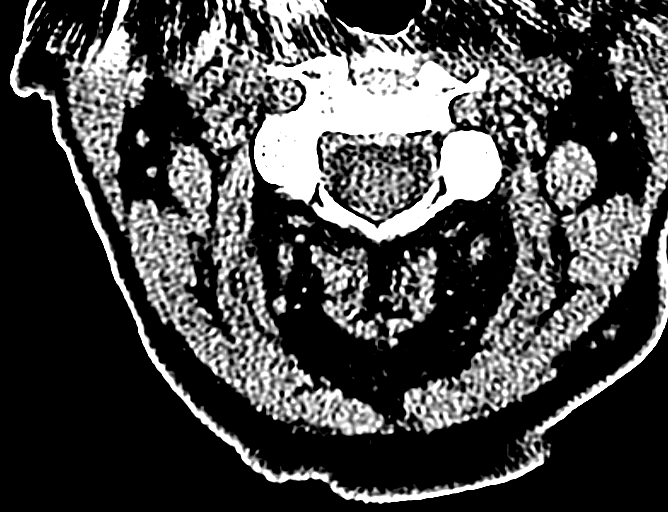
[im 60/77  brain]
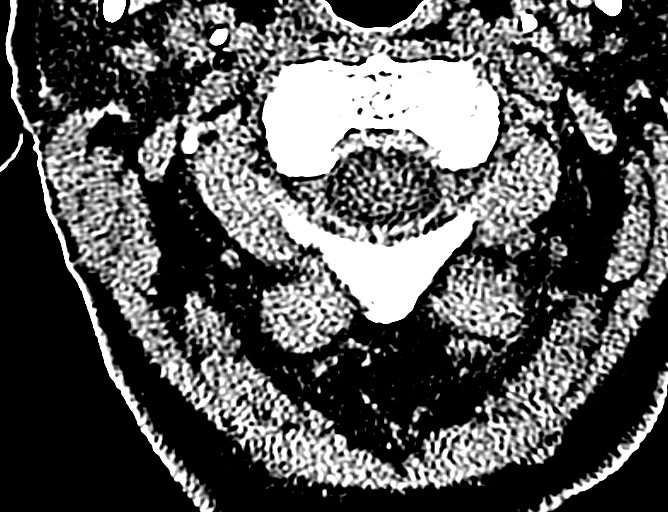
[im 68/77  brain]
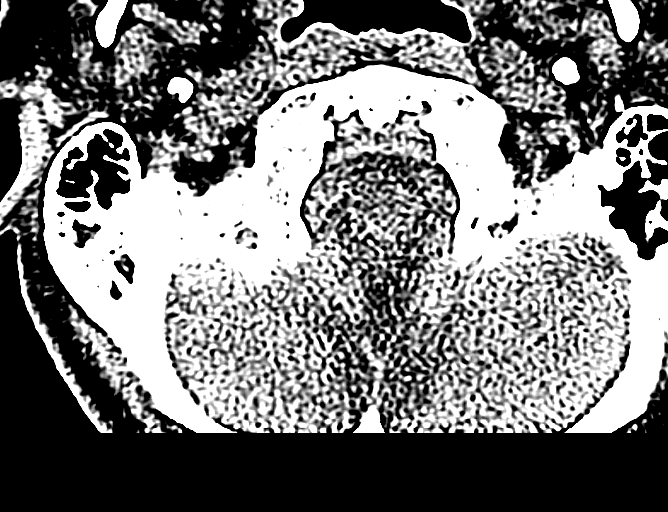

[14 of 30 positions shown; findings below may reference images not displayed]

FINDINGS: CT HEAD FINDINGS

There is moderate diffuse cerebral and cerebellar atrophy with
compensatory ventriculomegaly. There is no intracranial hemorrhage
nor acute ischemic change. The cerebellum and brainstem are
unremarkable.

The observed paranasal sinuses and mastoid air cells are clear.
There is no skull fracture. There is mild soft tissue swelling above
the right orbit with associated laceration.

CT CERVICAL SPINE FINDINGS

The cervical vertebral bodies are preserved in height. There is disc
space narrowing at C5-6 and at C6-7. The prevertebral soft tissue
spaces are normal. There is facet joint degenerative change at
multiple levels with fusion at C4-5 on the left. The odontoid is
intact. The pulmonary apices exhibit mild scarring.
IMPRESSION: 1. There is no acute intracranial hemorrhage nor other acute
posttraumatic injury of the brain.
2. There are age related atrophic changes which are stable.
3. There is no acute skull fracture.
4. There is degenerative change of the midcervical spine but no
acute fracture or dislocation.

## 2014-12-03 ENCOUNTER — Encounter: Payer: Self-pay | Admitting: Internal Medicine

## 2015-05-05 ENCOUNTER — Encounter: Payer: Self-pay | Admitting: Internal Medicine
